# Patient Record
Sex: Female | Born: 2000 | Race: White | Hispanic: No | Smoking: Never smoker
Health system: Southern US, Community
[De-identification: ages and names within clinical notes are randomized; demographics above are authoritative.]

## PROBLEM LIST (undated history)

## (undated) DIAGNOSIS — D649 Anemia, unspecified: Secondary | ICD-10-CM

---
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
## 2022-08-17 IMAGING — DX DG HAND COMPLETE 3+V*R*
3 series · 3 of 3 positions shown · non-contrast
Comparison: None.

CLINICAL DATA: Right hand pain.  Fall walking dog.

EXAM:
RIGHT HAND - COMPLETE 3+ VIEW

[hand ap]
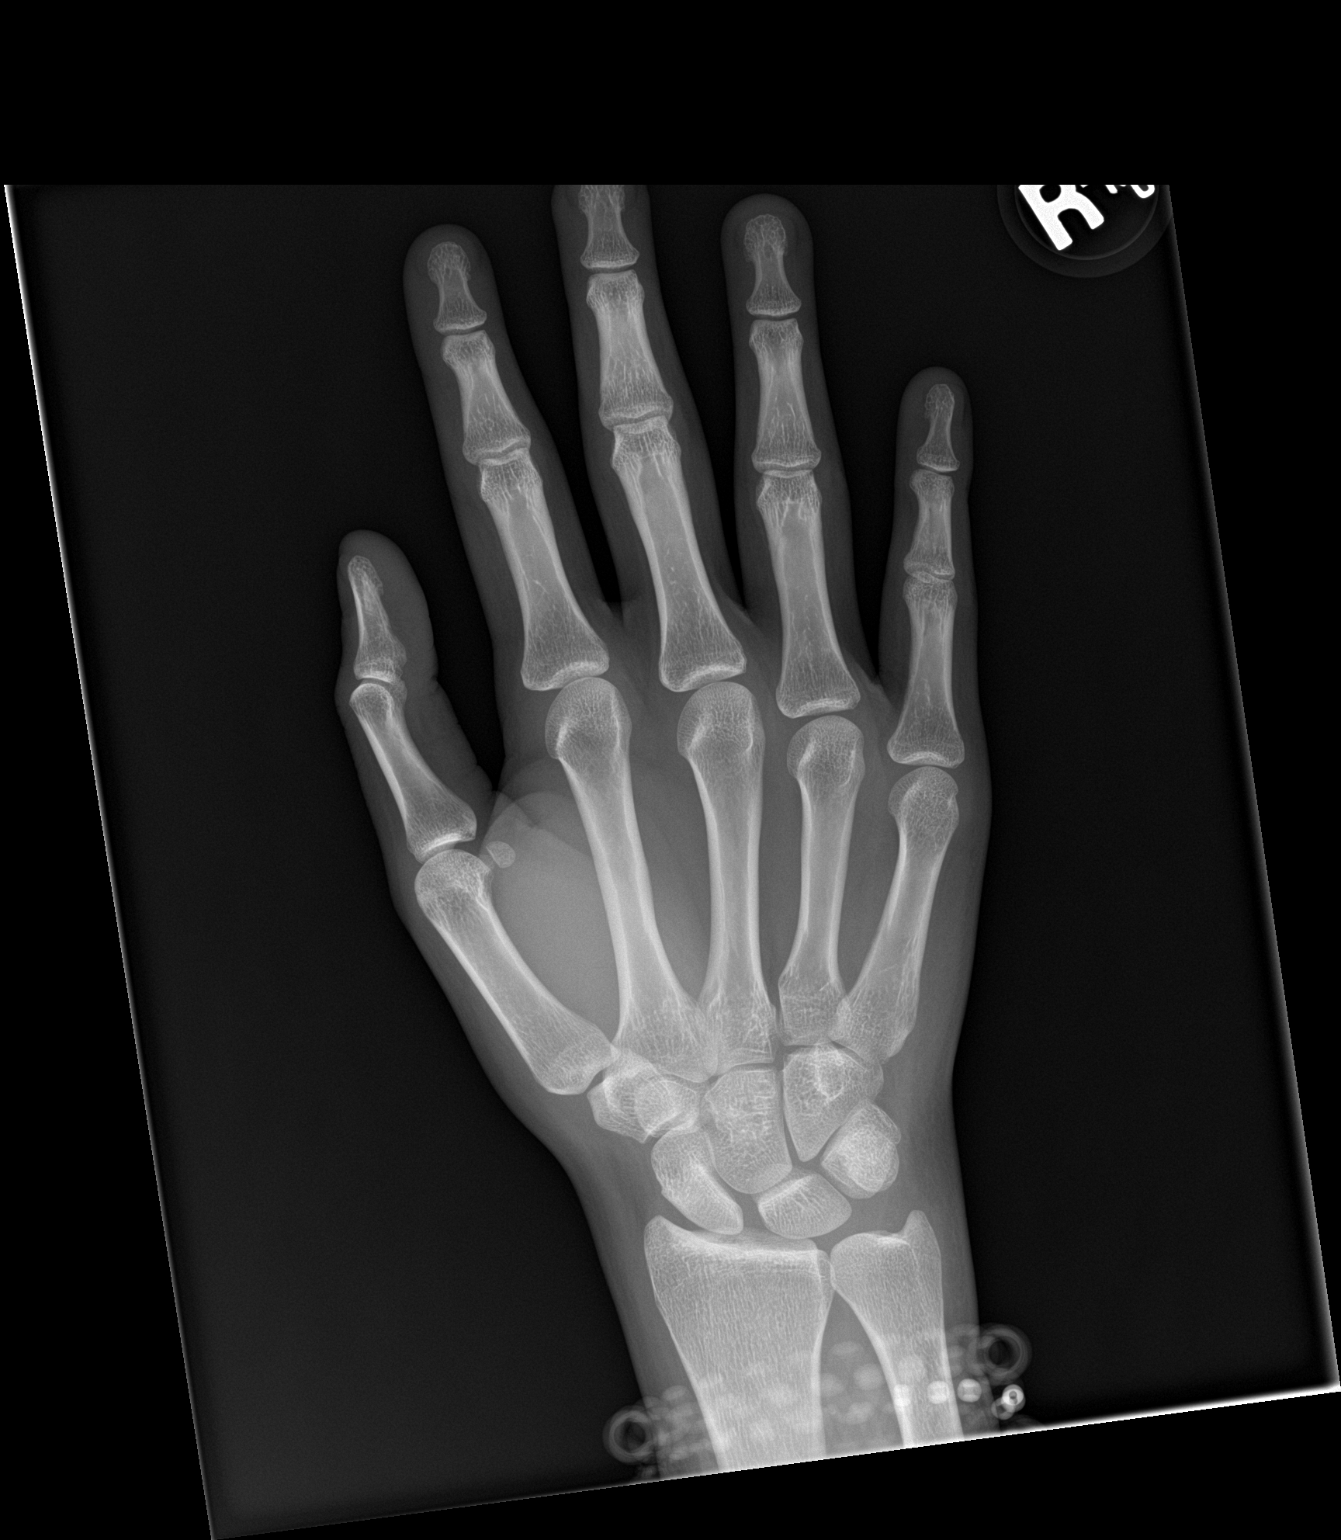

[hand obl]
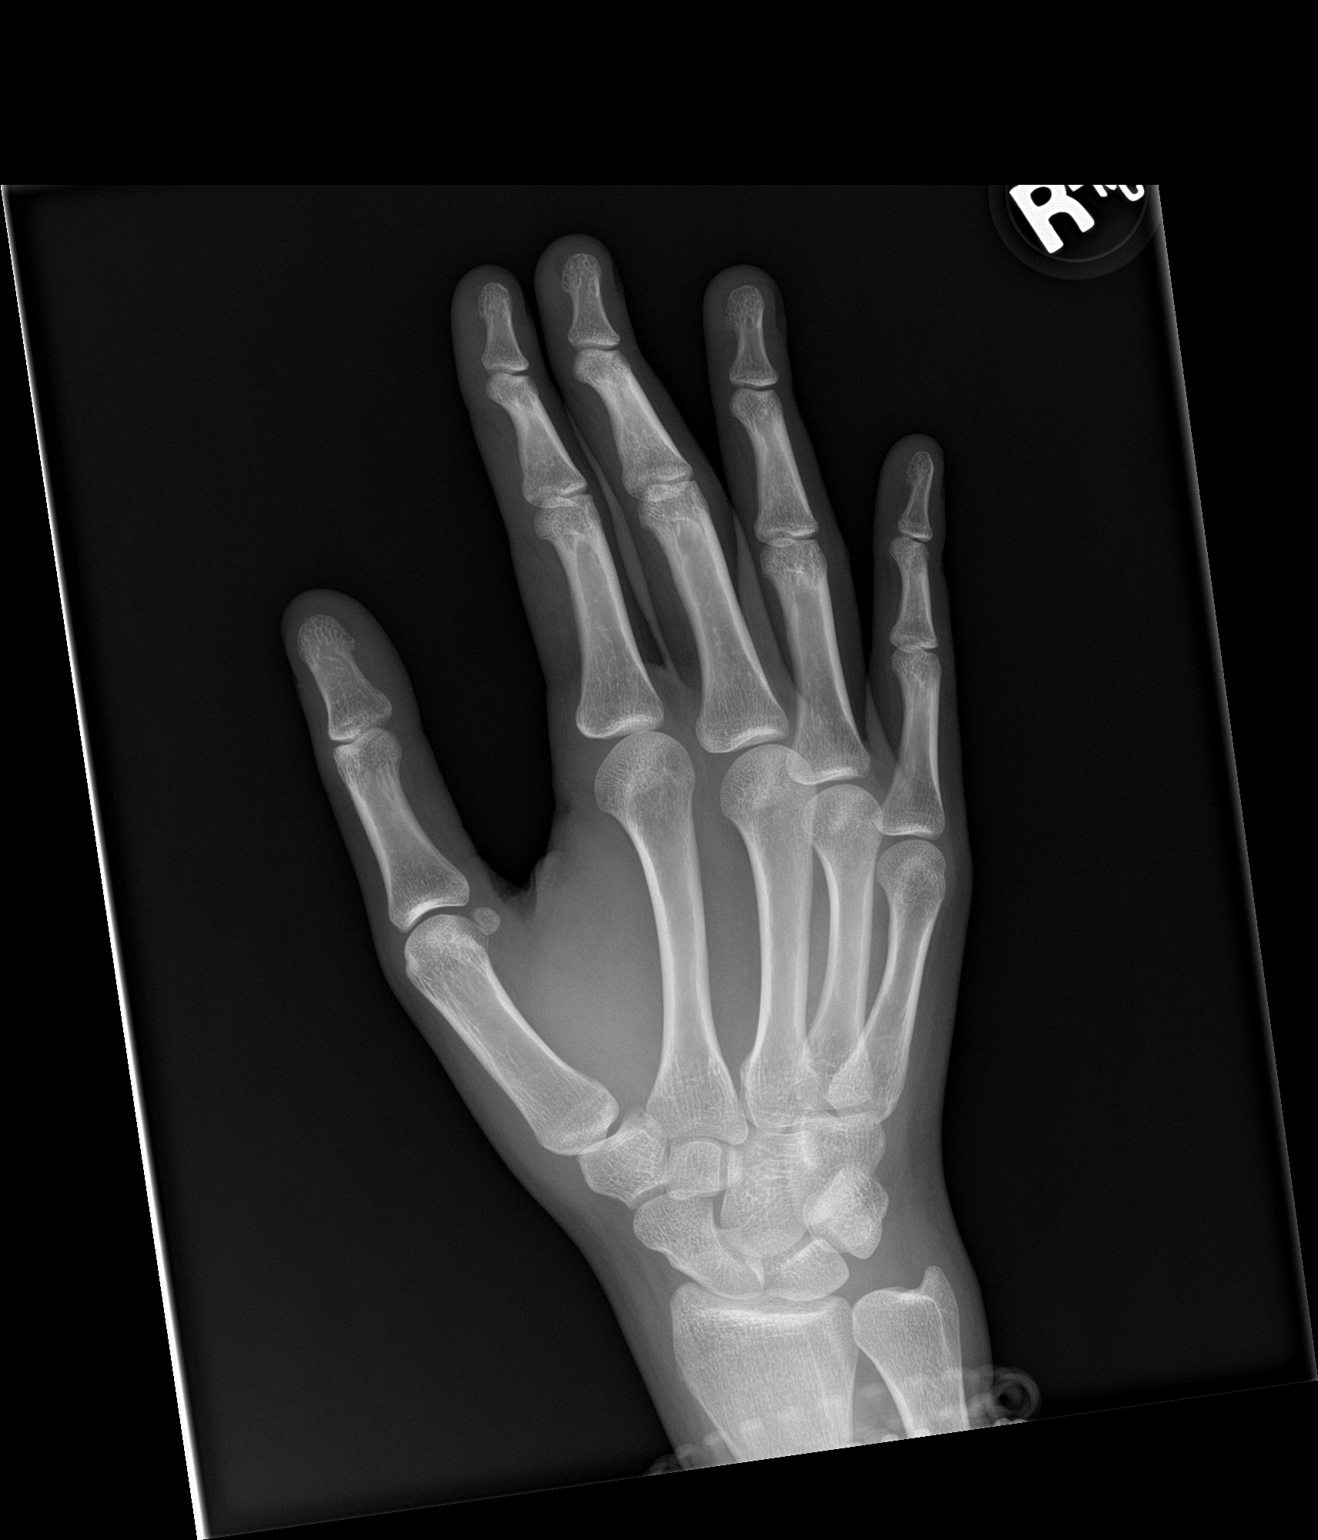

[hand lat]
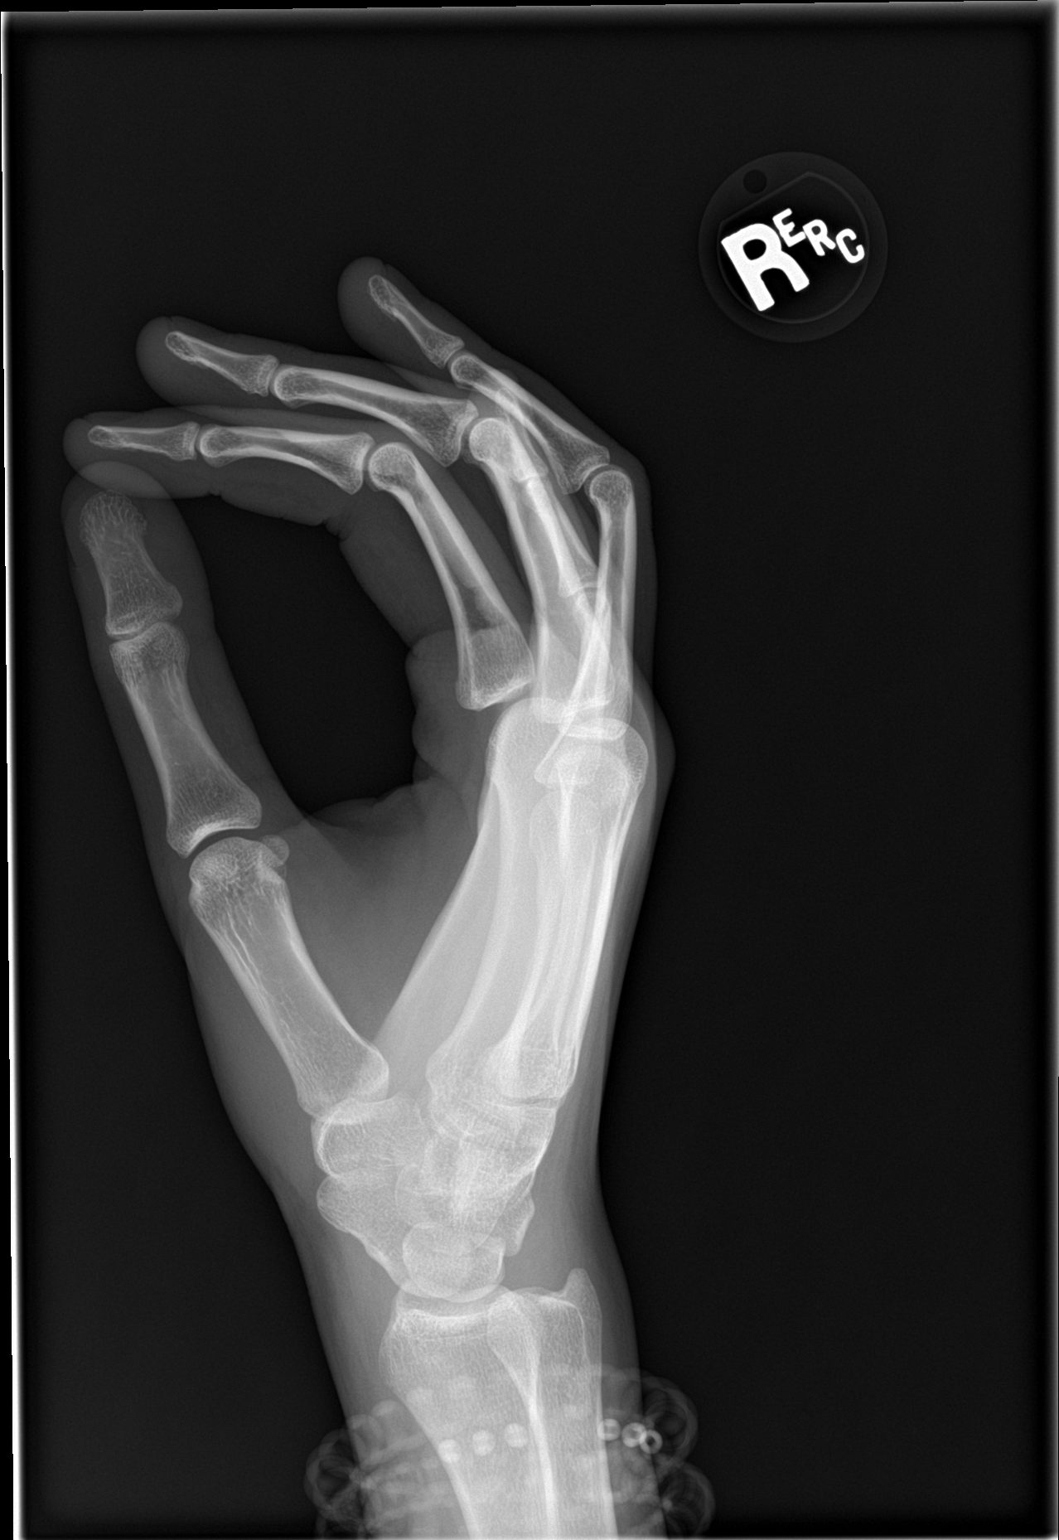

[3 of 3 positions shown; findings below may reference images not displayed]

FINDINGS: There is no evidence of fracture or dislocation. There is no
evidence of arthropathy or other focal bone abnormality. Soft
tissues are unremarkable.
IMPRESSION: Negative radiographs of the right hand.
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
## 2022-09-12 ENCOUNTER — Other Ambulatory Visit: Payer: Self-pay

## 2022-09-12 ENCOUNTER — Encounter: Payer: Self-pay | Admitting: Emergency Medicine

## 2022-09-12 ENCOUNTER — Emergency Department
Admission: EM | Admit: 2022-09-12 | Discharge: 2022-09-12 | Disposition: A | Discharge status: Home / Self Care | Payer: No Typology Code available for payment source | Attending: Emergency Medicine | Admitting: Emergency Medicine

## 2022-09-12 ENCOUNTER — Emergency Department: Payer: No Typology Code available for payment source

## 2022-09-12 DIAGNOSIS — S3992XA Unspecified injury of lower back, initial encounter: Secondary | ICD-10-CM | POA: Diagnosis present

## 2022-09-12 DIAGNOSIS — Y92410 Unspecified street and highway as the place of occurrence of the external cause: Secondary | ICD-10-CM | POA: Diagnosis not present

## 2022-09-12 DIAGNOSIS — S300XXA Contusion of lower back and pelvis, initial encounter: Secondary | ICD-10-CM | POA: Insufficient documentation

## 2022-09-12 LAB — POC URINE PREG, ED: Preg Test, Ur: NEGATIVE

## 2022-09-12 MED ORDER — MELOXICAM 15 MG PO TABS: 15.0000 mg | ORAL_TABLET | Freq: Every day | ORAL | 1 refills | Status: AC
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
## 2022-09-12 NOTE — Discharge Instructions (Addendum)
Take Meloxicam once daily for pain and inflammation.  
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
## 2022-10-27 ENCOUNTER — Other Ambulatory Visit: Payer: Self-pay

## 2022-10-27 ENCOUNTER — Encounter: Payer: Self-pay | Admitting: Emergency Medicine

## 2022-10-27 ENCOUNTER — Emergency Department
Admission: EM | Admit: 2022-10-27 | Discharge: 2022-10-27 | Disposition: A | Discharge status: Home / Self Care | Payer: Medicaid Other | Attending: Emergency Medicine | Admitting: Emergency Medicine

## 2022-10-27 DIAGNOSIS — T40601A Poisoning by unspecified narcotics, accidental (unintentional), initial encounter: Secondary | ICD-10-CM | POA: Diagnosis not present

## 2022-10-27 LAB — CBC
HCT: 35.3 % — ABNORMAL LOW (ref 36.0–46.0)
Hemoglobin: 11.1 g/dL — ABNORMAL LOW (ref 12.0–15.0)
MCH: 28.7 pg (ref 26.0–34.0)
MCHC: 31.4 g/dL (ref 30.0–36.0)
MCV: 91.2 fL (ref 80.0–100.0)
Platelets: 167 10*3/uL (ref 150–400)
RBC: 3.87 MIL/uL (ref 3.87–5.11)
RDW: 14.6 % (ref 11.5–15.5)
WBC: 13 10*3/uL — ABNORMAL HIGH (ref 4.0–10.5)
nRBC: 0 % (ref 0.0–0.2)

## 2022-10-27 LAB — ACETAMINOPHEN LEVEL: Acetaminophen (Tylenol), Serum: <10 ug/mL — ABNORMAL LOW (ref 10–30)

## 2022-10-27 LAB — URINE DRUG SCREEN, QUALITATIVE (ARMC ONLY)
Amphetamines, Ur Screen: NOT DETECTED
Barbiturates, Ur Screen: NOT DETECTED
Benzodiazepine, Ur Scrn: NOT DETECTED
Cannabinoid 50 Ng, Ur ~~LOC~~: POSITIVE — AB
Cocaine Metabolite,Ur ~~LOC~~: NOT DETECTED
MDMA (Ecstasy)Ur Screen: NOT DETECTED
Methadone Scn, Ur: NOT DETECTED
Opiate, Ur Screen: NOT DETECTED
Phencyclidine (PCP) Ur S: NOT DETECTED
Tricyclic, Ur Screen: NOT DETECTED

## 2022-10-27 LAB — COMPREHENSIVE METABOLIC PANEL
ALT: 14 U/L (ref 0–44)
AST: 17 U/L (ref 15–41)
Albumin: 3.5 g/dL (ref 3.5–5.0)
Alkaline Phosphatase: 52 U/L (ref 38–126)
Anion gap: 8 (ref 5–15)
BUN: 8 mg/dL (ref 6–20)
CO2: 25 mmol/L (ref 22–32)
Calcium: 8.4 mg/dL — ABNORMAL LOW (ref 8.9–10.3)
Chloride: 105 mmol/L (ref 98–111)
Creatinine, Ser: 0.79 mg/dL (ref 0.44–1.00)
GFR, Estimated: >60 mL/min (ref >60)
Glucose, Bld: 145 mg/dL — ABNORMAL HIGH (ref 70–99)
Potassium: 3.3 mmol/L — ABNORMAL LOW (ref 3.5–5.1)
Sodium: 138 mmol/L (ref 135–145)
Total Bilirubin: 0.5 mg/dL (ref 0.3–1.2)
Total Protein: 6.3 g/dL — ABNORMAL LOW (ref 6.5–8.1)

## 2022-10-27 LAB — SALICYLATE LEVEL: Salicylate Lvl: <7 mg/dL — ABNORMAL LOW (ref 7.0–30.0)

## 2022-10-27 LAB — ETHANOL: Alcohol, Ethyl (B): <10 mg/dL (ref <10)
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
## 2022-10-27 NOTE — Discharge Instructions (Signed)
Please seek medical attention for any high fevers, chest pain, shortness of breath, change in behavior, persistent vomiting, bloody stool or any other new or concerning symptoms.  

## 2022-10-27 NOTE — ED Notes (Signed)
This RN attempted to call the numbers listed for pt's grandfather and mother for update with no response. Pt's brother at bedside. Notified pt and brother about attempted contact.

## 2023-07-04 ENCOUNTER — Ambulatory Visit (HOSPITAL_COMMUNITY): Admission: EM | Admit: 2023-07-04 | Discharge: 2023-07-04 | Disposition: A | Discharge status: Home / Self Care
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
## 2023-08-23 ENCOUNTER — Emergency Department (HOSPITAL_COMMUNITY)
Admission: EM | Admit: 2023-08-23 | Discharge: 2023-08-23 | Discharge status: Against Medical Advice | Attending: Emergency Medicine | Admitting: Emergency Medicine

## 2023-08-23 ENCOUNTER — Other Ambulatory Visit: Payer: Self-pay

## 2023-08-23 ENCOUNTER — Encounter (HOSPITAL_COMMUNITY): Payer: Self-pay | Admitting: *Deleted

## 2023-08-23 DIAGNOSIS — Z5321 Procedure and treatment not carried out due to patient leaving prior to being seen by health care provider: Secondary | ICD-10-CM | POA: Insufficient documentation

## 2023-08-23 DIAGNOSIS — M545 Low back pain, unspecified: Secondary | ICD-10-CM | POA: Diagnosis not present

## 2023-08-23 DIAGNOSIS — Z202 Contact with and (suspected) exposure to infections with a predominantly sexual mode of transmission: Secondary | ICD-10-CM | POA: Insufficient documentation

## 2023-08-23 LAB — URINALYSIS, ROUTINE W REFLEX MICROSCOPIC
Bilirubin Urine: NEGATIVE
Glucose, UA: NEGATIVE mg/dL
Hgb urine dipstick: NEGATIVE
Ketones, ur: NEGATIVE mg/dL
Leukocytes,Ua: NEGATIVE
Nitrite: NEGATIVE
Protein, ur: NEGATIVE mg/dL
Specific Gravity, Urine: 1.021 (ref 1.005–1.030)
pH: 6 (ref 5.0–8.0)

## 2023-08-23 LAB — WET PREP, GENITAL
Clue Cells Wet Prep HPF POC: NONE SEEN
Sperm: NONE SEEN
Trich, Wet Prep: NONE SEEN
WBC, Wet Prep HPF POC: <10 (ref <10)
Yeast Wet Prep HPF POC: NONE SEEN

## 2023-08-23 LAB — HIV ANTIBODY (ROUTINE TESTING W REFLEX): HIV Screen 4th Generation wRfx: NONREACTIVE

## 2023-08-23 LAB — POC URINE PREG, ED: Preg Test, Ur: NEGATIVE
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
## 2023-08-23 NOTE — ED Notes (Signed)
Pt left AMA due to long ED wait times.

## 2023-08-24 LAB — GC/CHLAMYDIA PROBE AMP (~~LOC~~) NOT AT ARMC
Chlamydia: POSITIVE — AB
Comment: NEGATIVE
Comment: NORMAL
Neisseria Gonorrhea: NEGATIVE

## 2023-08-24 LAB — RPR: RPR Ser Ql: NONREACTIVE

## 2023-08-29 ENCOUNTER — Ambulatory Visit (HOSPITAL_COMMUNITY): Payer: Self-pay
